# Patient Record
Sex: Female | Born: 2000 | Hispanic: No | Marital: Single | Smoking: Never smoker
Health system: Southern US, Community
[De-identification: ages and names within clinical notes are randomized; demographics above are authoritative.]

---
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
## 2022-10-01 IMAGING — CR DG WRIST COMPLETE 3+V*R*
4 series · 4 of 4 positions shown · non-contrast
Comparison: None.

CLINICAL DATA: Fall

EXAM:
RIGHT WRIST - COMPLETE 3+ VIEW

[wrist pa]
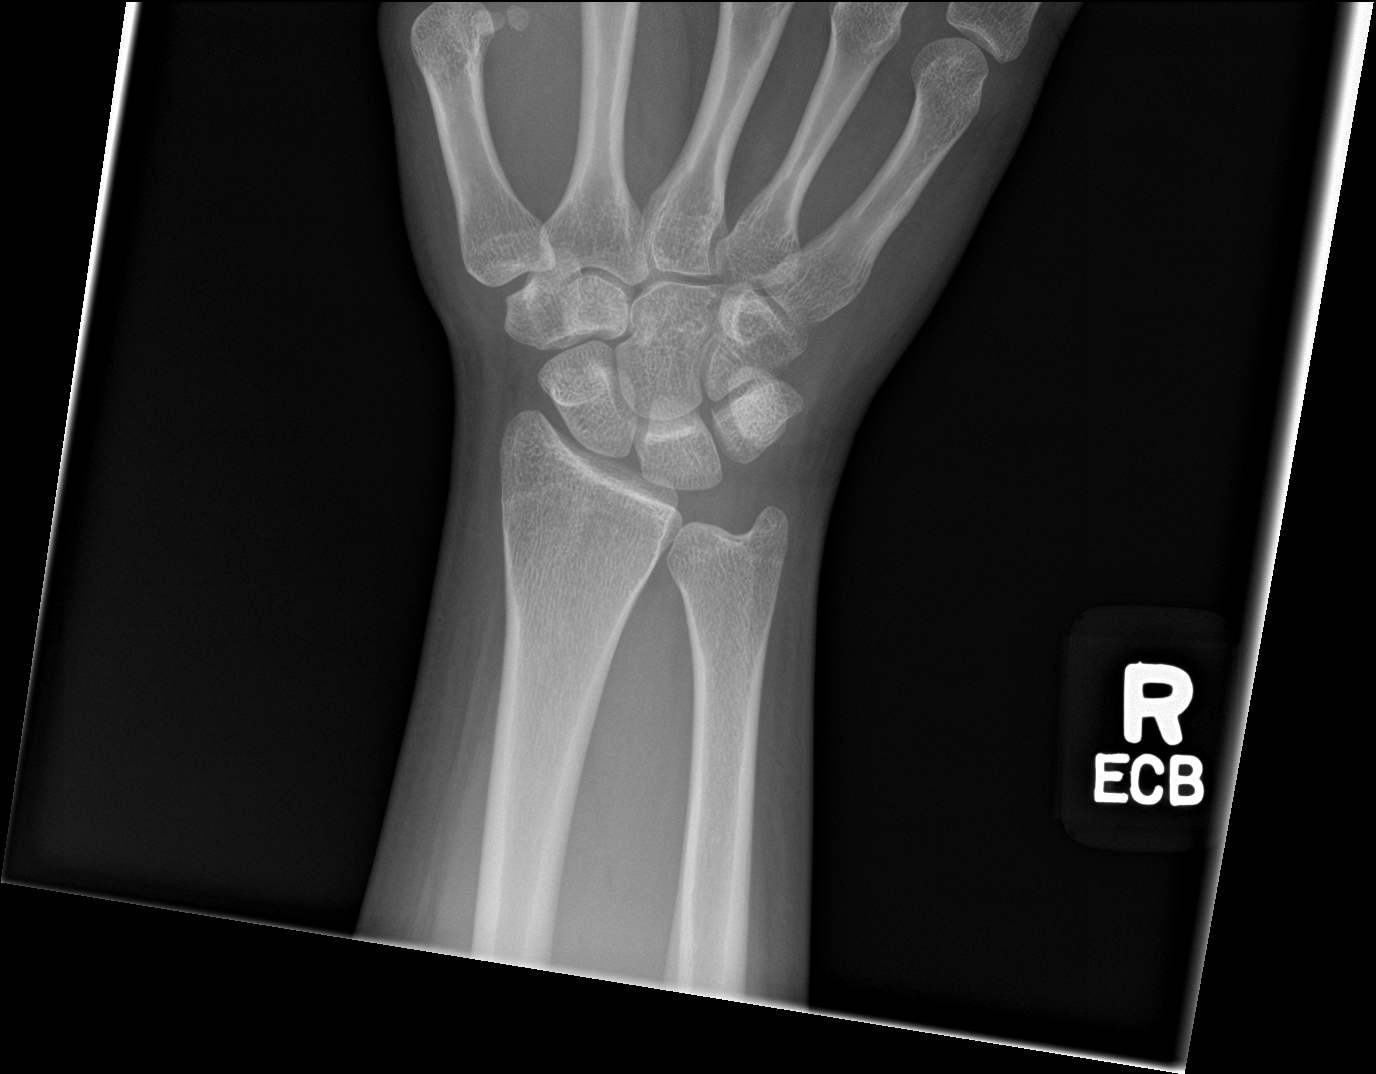

[wrist obl]
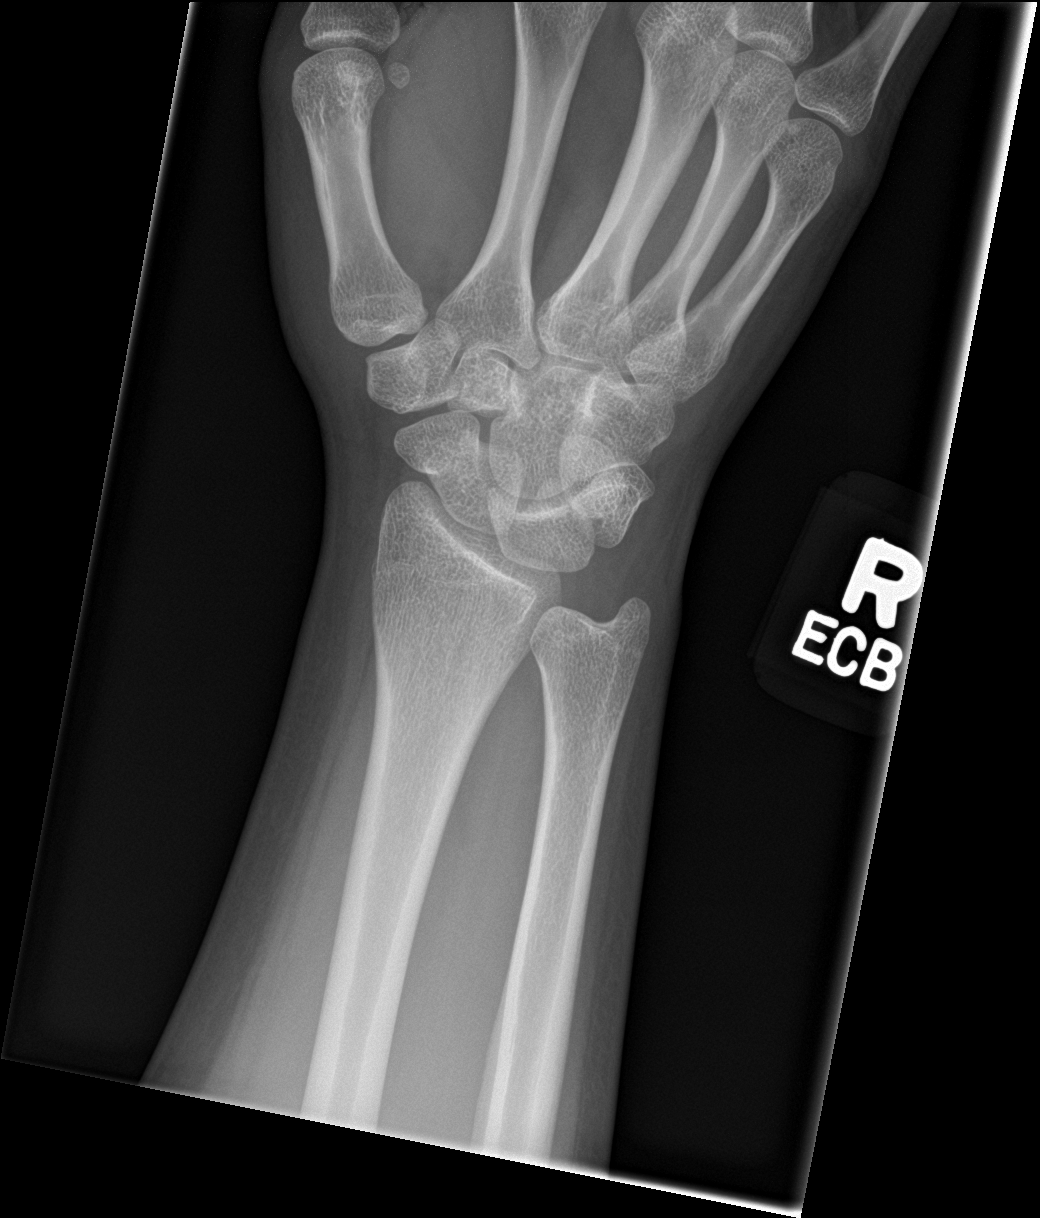

[wrist lat]
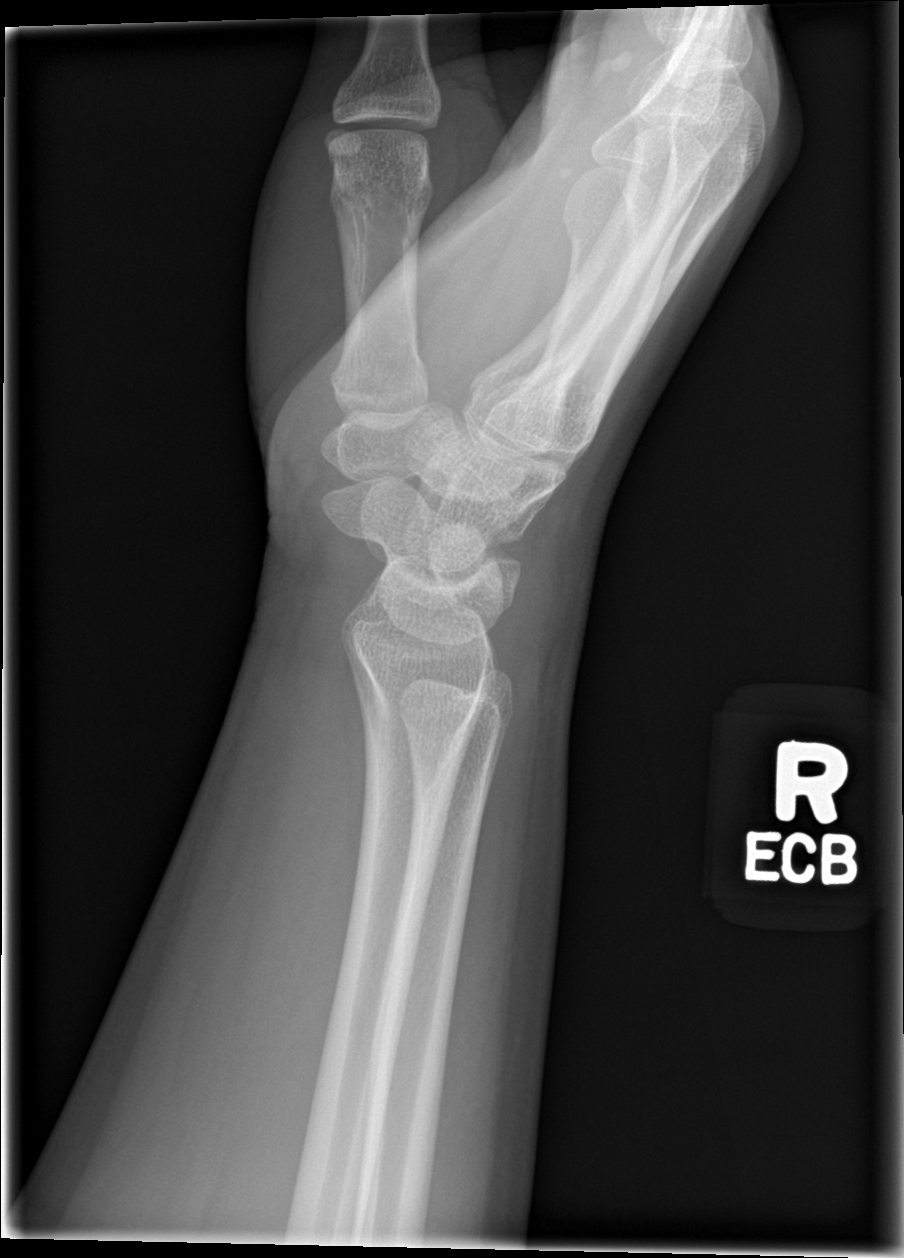

[wrist navicular]
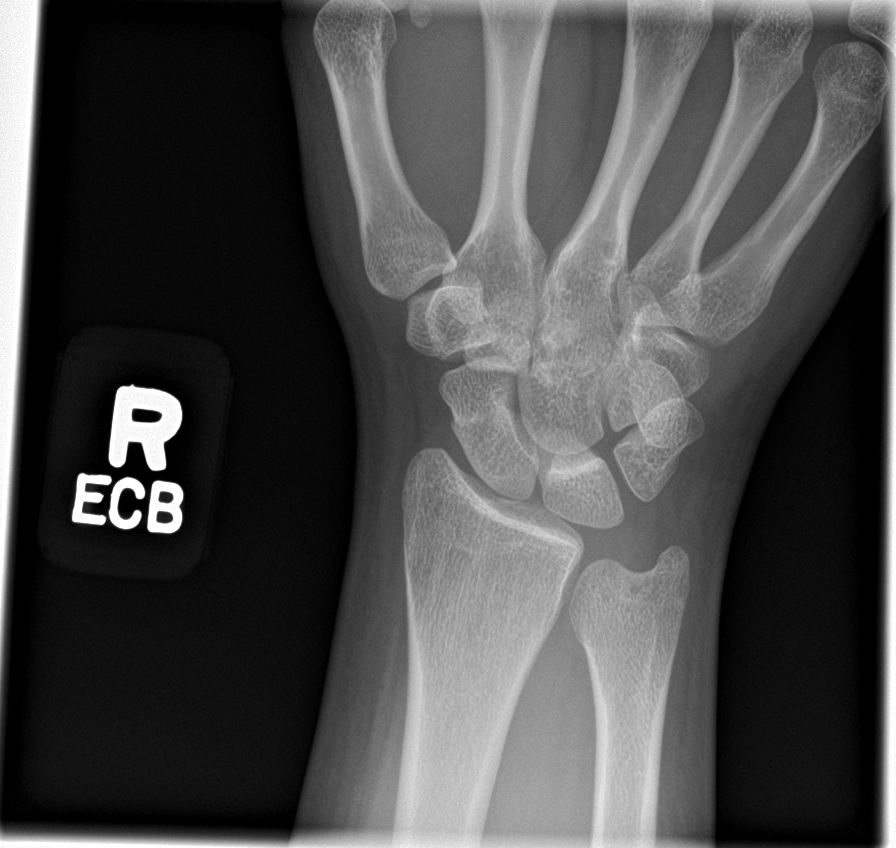

[4 of 4 positions shown; findings below may reference images not displayed]

FINDINGS: No fracture or dislocation is seen.

The joint spaces are preserved.

The visualized soft tissues are unremarkable.
IMPRESSION: Negative.
